# Patient Record
Sex: Male | Born: 1950 | Race: White | Hispanic: No | Smoking: Never smoker
Health system: Southern US, Community
[De-identification: ages and names within clinical notes are randomized; demographics above are authoritative.]

## PROBLEM LIST (undated history)

## (undated) DIAGNOSIS — I10 Essential (primary) hypertension: Secondary | ICD-10-CM

## (undated) DIAGNOSIS — R748 Abnormal levels of other serum enzymes: Secondary | ICD-10-CM

## (undated) DIAGNOSIS — K219 Gastro-esophageal reflux disease without esophagitis: Secondary | ICD-10-CM

## (undated) HISTORY — DX: Gastro-esophageal reflux disease without esophagitis: K21.9

## (undated) HISTORY — PX: OTHER SURGICAL HISTORY: SHX169

## (undated) HISTORY — PX: HERNIA REPAIR: SHX51

## (undated) HISTORY — DX: Abnormal levels of other serum enzymes: R74.8

## (undated) HISTORY — DX: Essential (primary) hypertension: I10

## (undated) HISTORY — PX: TONSILLECTOMY: SUR1361

---

## 2018-08-31 ENCOUNTER — Ambulatory Visit (INDEPENDENT_AMBULATORY_CARE_PROVIDER_SITE_OTHER): Payer: Medicare Other | Admitting: Internal Medicine

## 2018-08-31 ENCOUNTER — Encounter (INDEPENDENT_AMBULATORY_CARE_PROVIDER_SITE_OTHER): Payer: Self-pay | Admitting: Internal Medicine

## 2018-08-31 ENCOUNTER — Encounter (INDEPENDENT_AMBULATORY_CARE_PROVIDER_SITE_OTHER): Payer: Self-pay | Admitting: *Deleted

## 2018-08-31 VITALS — BP 142/80 | HR 109 | Temp 98.3°F | Resp 14 | Ht 74.0 in | Wt 194.4 lb

## 2018-08-31 DIAGNOSIS — R748 Abnormal levels of other serum enzymes: Secondary | ICD-10-CM | POA: Diagnosis not present

## 2018-08-31 HISTORY — DX: Abnormal levels of other serum enzymes: R74.8

## 2018-08-31 NOTE — Progress Notes (Signed)
   Subjective:    Patient ID: Bill Myers, male    DOB: [birth year], 67 y.o.   MRN: [id number]  HPI Referred by Shon HaleBrittany Joyce for elevated liver enzymes.  He drinks 4-5 beers a week. He use to drink a case of week. Retired [employer] His appetite is not okay. He sometimes does not feel hungry.  His bowel move okay.  His BM are normal. His BM move daily.  No IV drug use.   CV Liver Fibrosis Panel 9/`04/2018 Fibrosis score 0.29 -F1,,  Necroinflammat Act. Score 0.85 Haptoglobin 102, total bili 0.3, GGT 411, ALT 244 Necroinflammatory Activity Grade A3_ Severe activity.  08/05/2018 Acute hepatitis panel negative.  PT/INR 0.9 and 10.0, GGT 407.  07/23/2018 H and H 13.7 and 39.3, Platelet ct 208 Review of Systems Past Medical History:  Diagnosis Date  . GERD (gastroesophageal reflux disease)   . HTN (hypertension)     Past Surgical History:  Procedure Laterality Date  . HERNIA REPAIR      No Known Allergies  Current Outpatient Medications on File Prior to Visit  Medication Sig Dispense Refill  . esomeprazole (NEXIUM) 20 MG capsule Take by mouth.    Marland Kitchen. lisinopril (PRINIVIL,ZESTRIL) 20 MG tablet Take 20 mg by mouth daily.  2  . spironolactone (ALDACTONE) 25 MG tablet Take 25 mg by mouth daily.  2   No current facility-administered medications on file prior to visit.         Objective:   Physical Exam Blood pressure (!) 142/80, pulse (!) 109, temperature 98.3 F (36.8 C), resp. rate 14, height 6\' 2"  (1.88 m), weight 194 lb 6.4 oz (88.2 kg).  Alert and oriented. Skin warm and dry. Oral mucosa is moist.   . Sclera anicteric, conjunctivae is pink. Thyroid not enlarged. No cervical lymphadenopathy. Lungs clear. Heart regular rate and rhythm.  Abdomen is soft. Bowel sounds are positive. No hepatomegaly. No abdominal masses felt. No tenderness.  No edema to lower extremities.          Assessment & Plan:  Elevated liver enzymes.  Will repeat Liver enzymes.  Platelet ct looks  good.  US RUQ.  Needs to exercise. Try to stop drinking.

## 2018-08-31 NOTE — Patient Instructions (Signed)
US RUQ 

## 2018-09-01 ENCOUNTER — Telehealth (INDEPENDENT_AMBULATORY_CARE_PROVIDER_SITE_OTHER): Payer: Self-pay | Admitting: Internal Medicine

## 2018-09-01 DIAGNOSIS — K760 Fatty (change of) liver, not elsewhere classified: Secondary | ICD-10-CM

## 2018-09-01 DIAGNOSIS — R748 Abnormal levels of other serum enzymes: Secondary | ICD-10-CM

## 2018-09-01 LAB — HEPATIC FUNCTION PANEL
AG Ratio: 1.6 (calc) (ref 1.0–2.5)
ALBUMIN MSPROF: 4.6 g/dL (ref 3.6–5.1)
ALT: 127 U/L — ABNORMAL HIGH (ref 9–46)
AST: 146 U/L — AB (ref 10–35)
Alkaline phosphatase (APISO): 65 U/L (ref 40–115)
BILIRUBIN DIRECT: 0.2 mg/dL (ref 0.0–0.2)
BILIRUBIN TOTAL: 0.6 mg/dL (ref 0.2–1.2)
Globulin: 2.8 g/dL (calc) (ref 1.9–3.7)
Indirect Bilirubin: 0.4 mg/dL (calc) (ref 0.2–1.2)
Total Protein: 7.4 g/dL (ref 6.1–8.1)

## 2018-09-01 NOTE — Telephone Encounter (Signed)
Labs for auto immune process ordered.

## 2018-09-03 ENCOUNTER — Ambulatory Visit (HOSPITAL_COMMUNITY)
Admission: RE | Admit: 2018-09-03 | Discharge: 2018-09-03 | Disposition: A | Discharge status: Home / Self Care | Payer: Medicare Other | Source: Ambulatory Visit | Attending: Internal Medicine | Admitting: Internal Medicine

## 2018-09-03 DIAGNOSIS — R748 Abnormal levels of other serum enzymes: Secondary | ICD-10-CM | POA: Diagnosis present

## 2018-09-07 LAB — ALPHA-1-ANTITRYPSIN: A1 ANTITRYPSIN SER: 116 mg/dL (ref 83–199)

## 2018-09-07 LAB — FERRITIN: Ferritin: 1642 ng/mL — ABNORMAL HIGH (ref 24–380)

## 2018-09-07 LAB — PROTIME-INR
INR: 0.9
Prothrombin Time: 9.9 s (ref 9.0–11.5)

## 2018-09-07 LAB — CERULOPLASMIN: CERULOPLASMIN: 29 mg/dL (ref 18–36)

## 2018-09-07 LAB — IGG: IGG (IMMUNOGLOBIN G), SERUM: 1095 mg/dL (ref 600–1540)

## 2018-09-07 LAB — ANTI-SMOOTH MUSCLE ANTIBODY, IGG

## 2018-09-07 LAB — ANTI-NUCLEAR AB-TITER (ANA TITER)

## 2018-09-07 LAB — ANA: ANA: POSITIVE — AB

## 2018-09-09 ENCOUNTER — Telehealth (INDEPENDENT_AMBULATORY_CARE_PROVIDER_SITE_OTHER): Payer: Self-pay | Admitting: Internal Medicine

## 2018-09-09 DIAGNOSIS — R7989 Other specified abnormal findings of blood chemistry: Secondary | ICD-10-CM

## 2018-09-09 DIAGNOSIS — K760 Fatty (change of) liver, not elsewhere classified: Secondary | ICD-10-CM

## 2018-09-09 DIAGNOSIS — R748 Abnormal levels of other serum enzymes: Secondary | ICD-10-CM

## 2018-09-09 DIAGNOSIS — K7400 Hepatic fibrosis, unspecified: Secondary | ICD-10-CM

## 2018-09-09 NOTE — Telephone Encounter (Signed)
Iron and TIBC ordered

## 2018-09-09 NOTE — Telephone Encounter (Signed)
[place] sch'd 09/14/18 at 130 (1015), npo after midnight, patient aware

## 2018-09-09 NOTE — Telephone Encounter (Signed)
[place] RUQ elast ordered

## 2018-09-10 ENCOUNTER — Other Ambulatory Visit (INDEPENDENT_AMBULATORY_CARE_PROVIDER_SITE_OTHER): Payer: Self-pay | Admitting: Internal Medicine

## 2018-09-10 LAB — IRON, TOTAL/TOTAL IRON BINDING CAP
%SAT: 57 % — AB (ref 20–48)
Iron: 152 ug/dL (ref 50–180)
TIBC: 269 ug/dL (ref 250–425)

## 2018-09-14 ENCOUNTER — Ambulatory Visit (HOSPITAL_COMMUNITY)
Admission: RE | Admit: 2018-09-14 | Discharge: 2018-09-14 | Disposition: A | Discharge status: Home / Self Care | Payer: Medicare Other | Source: Ambulatory Visit | Attending: Internal Medicine | Admitting: Internal Medicine

## 2018-09-14 DIAGNOSIS — R748 Abnormal levels of other serum enzymes: Secondary | ICD-10-CM

## 2018-09-14 DIAGNOSIS — K760 Fatty (change of) liver, not elsewhere classified: Secondary | ICD-10-CM | POA: Diagnosis not present

## 2018-09-14 DIAGNOSIS — K7400 Hepatic fibrosis, unspecified: Secondary | ICD-10-CM

## 2018-09-14 DIAGNOSIS — R7989 Other specified abnormal findings of blood chemistry: Secondary | ICD-10-CM | POA: Diagnosis not present

## 2018-09-17 ENCOUNTER — Other Ambulatory Visit (INDEPENDENT_AMBULATORY_CARE_PROVIDER_SITE_OTHER): Payer: Self-pay | Admitting: *Deleted

## 2018-09-17 DIAGNOSIS — R7989 Other specified abnormal findings of blood chemistry: Secondary | ICD-10-CM

## 2018-09-17 DIAGNOSIS — K7400 Hepatic fibrosis, unspecified: Secondary | ICD-10-CM

## 2018-09-17 DIAGNOSIS — R748 Abnormal levels of other serum enzymes: Secondary | ICD-10-CM

## 2018-10-12 ENCOUNTER — Other Ambulatory Visit (INDEPENDENT_AMBULATORY_CARE_PROVIDER_SITE_OTHER): Payer: Self-pay | Admitting: *Deleted

## 2018-10-12 ENCOUNTER — Encounter (INDEPENDENT_AMBULATORY_CARE_PROVIDER_SITE_OTHER): Payer: Self-pay | Admitting: *Deleted

## 2018-10-12 DIAGNOSIS — R748 Abnormal levels of other serum enzymes: Secondary | ICD-10-CM

## 2018-10-12 DIAGNOSIS — K7400 Hepatic fibrosis, unspecified: Secondary | ICD-10-CM

## 2018-10-12 DIAGNOSIS — R7989 Other specified abnormal findings of blood chemistry: Secondary | ICD-10-CM

## 2018-11-10 LAB — HEPATIC FUNCTION PANEL
AG Ratio: 1.7 (calc) (ref 1.0–2.5)
ALBUMIN MSPROF: 4.5 g/dL (ref 3.6–5.1)
ALT: 29 U/L (ref 9–46)
AST: 41 U/L — ABNORMAL HIGH (ref 10–35)
Alkaline phosphatase (APISO): 45 U/L (ref 40–115)
BILIRUBIN DIRECT: 0.1 mg/dL (ref 0.0–0.2)
BILIRUBIN INDIRECT: 0.4 mg/dL (ref 0.2–1.2)
BILIRUBIN TOTAL: 0.5 mg/dL (ref 0.2–1.2)
Globulin: 2.7 g/dL (calc) (ref 1.9–3.7)
Total Protein: 7.2 g/dL (ref 6.1–8.1)

## 2018-11-11 ENCOUNTER — Other Ambulatory Visit (INDEPENDENT_AMBULATORY_CARE_PROVIDER_SITE_OTHER): Payer: Self-pay | Admitting: *Deleted

## 2018-11-11 DIAGNOSIS — R7989 Other specified abnormal findings of blood chemistry: Secondary | ICD-10-CM

## 2018-11-11 DIAGNOSIS — K7400 Hepatic fibrosis, unspecified: Secondary | ICD-10-CM

## 2018-11-11 DIAGNOSIS — R748 Abnormal levels of other serum enzymes: Secondary | ICD-10-CM

## 2019-01-11 ENCOUNTER — Encounter (INDEPENDENT_AMBULATORY_CARE_PROVIDER_SITE_OTHER): Payer: Self-pay | Admitting: *Deleted

## 2019-01-11 ENCOUNTER — Other Ambulatory Visit (INDEPENDENT_AMBULATORY_CARE_PROVIDER_SITE_OTHER): Payer: Self-pay | Admitting: *Deleted

## 2019-01-11 DIAGNOSIS — K7400 Hepatic fibrosis, unspecified: Secondary | ICD-10-CM

## 2019-01-11 DIAGNOSIS — R748 Abnormal levels of other serum enzymes: Secondary | ICD-10-CM

## 2019-01-11 DIAGNOSIS — R7989 Other specified abnormal findings of blood chemistry: Secondary | ICD-10-CM

## 2019-02-06 LAB — HEPATIC FUNCTION PANEL
AG RATIO: 1.6 (calc) (ref 1.0–2.5)
ALBUMIN MSPROF: 4.5 g/dL (ref 3.6–5.1)
ALT: 22 U/L (ref 9–46)
AST: 25 U/L (ref 10–35)
Alkaline phosphatase (APISO): 45 U/L (ref 35–144)
BILIRUBIN DIRECT: 0.2 mg/dL (ref 0.0–0.2)
BILIRUBIN TOTAL: 0.7 mg/dL (ref 0.2–1.2)
GLOBULIN: 2.9 g/dL (ref 1.9–3.7)
Indirect Bilirubin: 0.5 mg/dL (calc) (ref 0.2–1.2)
Total Protein: 7.4 g/dL (ref 6.1–8.1)

## 2019-05-12 ENCOUNTER — Ambulatory Visit (INDEPENDENT_AMBULATORY_CARE_PROVIDER_SITE_OTHER): Payer: Medicare Other | Admitting: Internal Medicine

## 2019-05-12 ENCOUNTER — Encounter (INDEPENDENT_AMBULATORY_CARE_PROVIDER_SITE_OTHER): Payer: Self-pay | Admitting: Internal Medicine

## 2019-05-12 ENCOUNTER — Other Ambulatory Visit: Payer: Self-pay

## 2019-05-12 VITALS — BP 147/77 | HR 77 | Temp 98.1°F | Ht 76.0 in | Wt 186.3 lb

## 2019-05-12 DIAGNOSIS — R7989 Other specified abnormal findings of blood chemistry: Secondary | ICD-10-CM | POA: Diagnosis not present

## 2019-05-12 DIAGNOSIS — R748 Abnormal levels of other serum enzymes: Secondary | ICD-10-CM | POA: Diagnosis not present

## 2019-05-12 NOTE — Progress Notes (Signed)
   Subjective:    Patient ID: Bill Myers, male    DOB: 08-03-1951, 68 y.o.   MRN: [id number]  HPI Here today for f/u of elevated liver enzymes. Last seen 08/31/2018. Had been drinking 4-5 beers a days. In January he stopped drinking.  He is doing well. He is retired. He walks a mile on the treadmill every morning. He has gained from 174 to 186 He occasionally has constipation and takes a stool softener. Has never smoked.  08/05/2018 Acute hepatitis panel negative.  GGT 407.  09/02/2018 Igg 1,095, ANA 1:320, Ferritin 1,642, SMA less than 20, Ceruloplasmin 29, Alpha 1 antitrypsin 116.  INR 0.9, PT 9.9. Iron 152, TIBC 269, % Sat 57.   09/14/2018 US Abdomen with Elast: F0-F1 IMPRESSION: 1. Inhomogeneous echogenic liver parenchyma consistent with hepatic steatosis. 2. No gallstones.    09/03/2018 [place] RUQ:   Hepatic Function Latest Ref Rng & Units 02/05/2019 11/10/2018 08/31/2018  Total Protein 6.1 - 8.1 g/dL 7.4 7.2 7.4  AST 10 - 35 U/L 25 41(H) 146(H)  ALT 9 - 46 U/L 22 29 127(H)  Total Bilirubin 0.2 - 1.2 mg/dL 0.7 0.5 0.6  Bilirubin, Direct 0.0 - 0.2 mg/dL 0.2 0.1 0.2         CV Liver Fibrosis Panel 08/20/2018 Fibrosis score 0.29 -F1,,  Necroinflammat Act. Score 0.85 Haptoglobin 102, total bili 0.3, GGT 411, ALT 244 Necroinflammatory Activity Grade A3_ Severe activity.  08/05/2018 Acute hepatitis panel negative.  PT/INR 0.9 and 10.0, GGT 407.  07/23/2018 H and H 13.7 and 39.3, Platelet ct 208 Review of Systems Past Medical History:  Diagnosis Date  . Elevated liver enzymes 08/31/2018  . GERD (gastroesophageal reflux disease)   . HTN (hypertension)     Past Surgical History:  Procedure Laterality Date  . HERNIA REPAIR    . TONSILLECTOMY    . vascectomy      No Known Allergies  Current Outpatient Medications on File Prior to Visit  Medication Sig Dispense Refill  . lisinopril (PRINIVIL,ZESTRIL) 20 MG tablet Take 20 mg by mouth daily.  2  . spironolactone  (ALDACTONE) 25 MG tablet Take 25 mg by mouth daily.  2   No current facility-administered medications on file prior to visit.         Objective:   Physical Exam Blood pressure (!) 147/77, pulse 77, temperature 98.1 F (36.7 C), height 6\' 4"  (1.93 m), weight 186 lb 4.8 oz (84.5 kg).  Alert and oriented. Skin warm and dry. Oral mucosa is moist.   . Sclera anicteric, conjunctivae is pink. Thyroid not enlarged. No cervical lymphadenopathy. Lungs clear. Heart regular rate and rhythm.  Abdomen is soft. Bowel sounds are positive. No hepatomegaly. No abdominal masses felt. No tenderness.  No edema to lower extremities.        Assessment & Plan:  Elevated liver enzymes. Elevated ferritin. Am going to repeat Hepatic, Ferritin and CBC.  OV in 1 year.

## 2019-05-12 NOTE — Patient Instructions (Signed)
CBC, Hepatic and ferritin. OV in 1 year.

## 2019-05-13 LAB — FERRITIN: Ferritin: 338 ng/mL (ref 24–380)

## 2019-05-13 LAB — CBC WITH DIFFERENTIAL/PLATELET
Absolute Monocytes: 595 cells/uL (ref 200–950)
Basophils Absolute: 19 cells/uL (ref 0–200)
Basophils Relative: 0.4 %
Eosinophils Absolute: 72 cells/uL (ref 15–500)
Eosinophils Relative: 1.5 %
HCT: 37.4 % — ABNORMAL LOW (ref 38.5–50.0)
Hemoglobin: 12.6 g/dL — ABNORMAL LOW (ref 13.2–17.1)
Lymphs Abs: 1781 cells/uL (ref 850–3900)
MCH: 31.7 pg (ref 27.0–33.0)
MCHC: 33.7 g/dL (ref 32.0–36.0)
MCV: 94 fL (ref 80.0–100.0)
MPV: 9.9 fL (ref 7.5–12.5)
Monocytes Relative: 12.4 %
Neutro Abs: 2333 cells/uL (ref 1500–7800)
Neutrophils Relative %: 48.6 %
Platelets: 255 10*3/uL (ref 140–400)
RBC: 3.98 10*6/uL — ABNORMAL LOW (ref 4.20–5.80)
RDW: 12 % (ref 11.0–15.0)
Total Lymphocyte: 37.1 %
WBC: 4.8 10*3/uL (ref 3.8–10.8)

## 2019-05-13 LAB — HEPATIC FUNCTION PANEL
AG Ratio: 2 (calc) (ref 1.0–2.5)
ALT: 11 U/L (ref 9–46)
AST: 18 U/L (ref 10–35)
Albumin: 4.7 g/dL (ref 3.6–5.1)
Alkaline phosphatase (APISO): 52 U/L (ref 35–144)
Bilirubin, Direct: 0.1 mg/dL (ref 0.0–0.2)
Globulin: 2.4 g/dL (calc) (ref 1.9–3.7)
Indirect Bilirubin: 0.3 mg/dL (calc) (ref 0.2–1.2)
Total Bilirubin: 0.4 mg/dL (ref 0.2–1.2)
Total Protein: 7.1 g/dL (ref 6.1–8.1)

## 2019-05-17 ENCOUNTER — Other Ambulatory Visit (INDEPENDENT_AMBULATORY_CARE_PROVIDER_SITE_OTHER): Payer: Self-pay | Admitting: *Deleted

## 2019-05-17 DIAGNOSIS — R7989 Other specified abnormal findings of blood chemistry: Secondary | ICD-10-CM

## 2019-08-28 IMAGING — US US ABDOMEN LIMITED W/ ELASTOGRAPHY
1 series · 13 of 25 positions shown · non-contrast
Comparison: 09/03/2018 right upper quadrant abdominal sonogram.

CLINICAL DATA: Elevated liver enzymes.  Elevated ferritin.

EXAM:
US ABDOMEN LIMITED - RIGHT UPPER QUADRANT
ULTRASOUND HEPATIC ELASTOGRAPHY
TECHNIQUE: Limited right upper quadrant abdominal ultrasound was performed. In
addition, ultrasound elastography evaluation of the liver was
performed. A region of interest was placed in the right lobe of the
liver. Following application of a compressive sonographic pulse,
shear waves were detected in the adjacent hepatic tissue and the
shear wave velocity was calculated. Multiple assessments were
performed at the selected site. Median shear wave velocity is
correlated to a Metavir fibrosis score.

[Series 1: us abdomen limited w/ elastography · 13 of 38 slices shown]
[im 1/38]
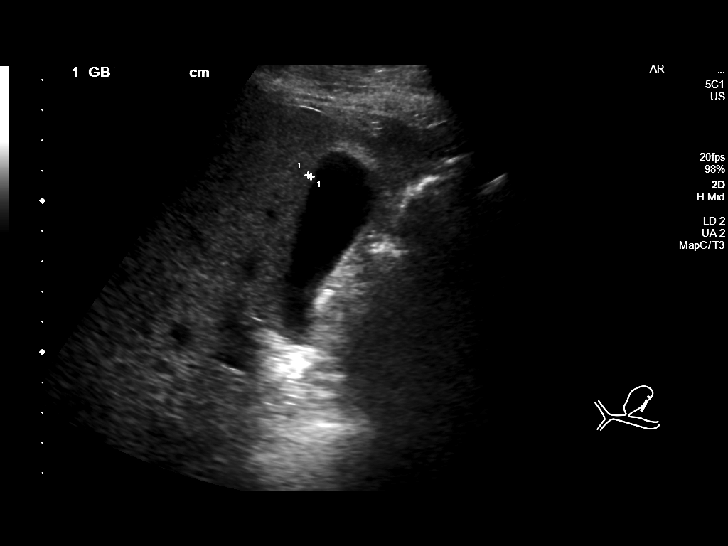
[im 4/38]
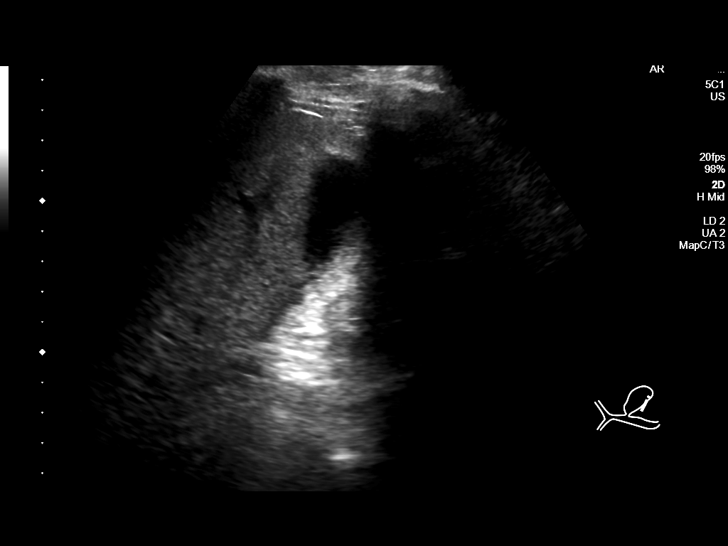
[im 7/38]
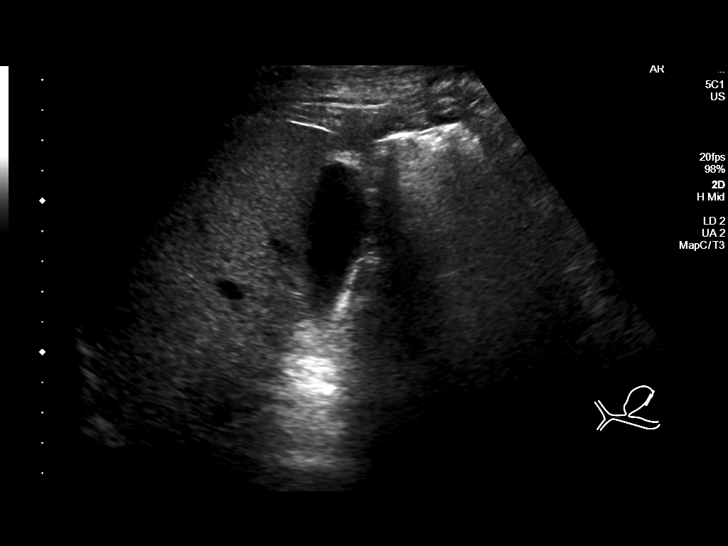
[im 10/38]
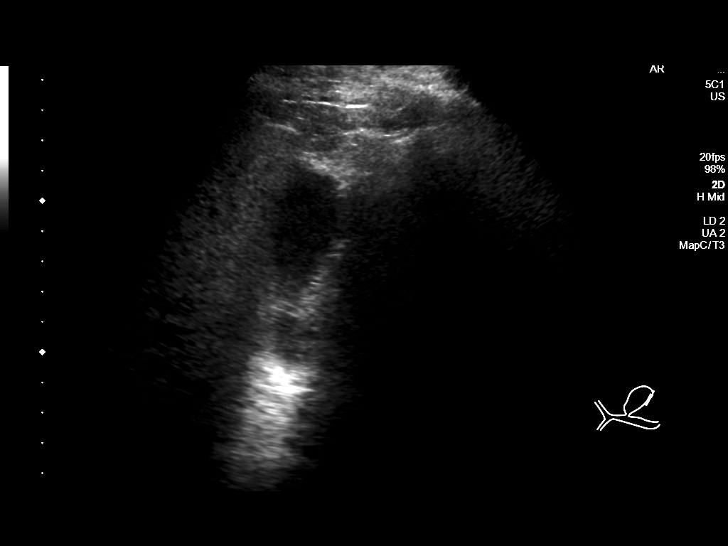
[im 13/38]
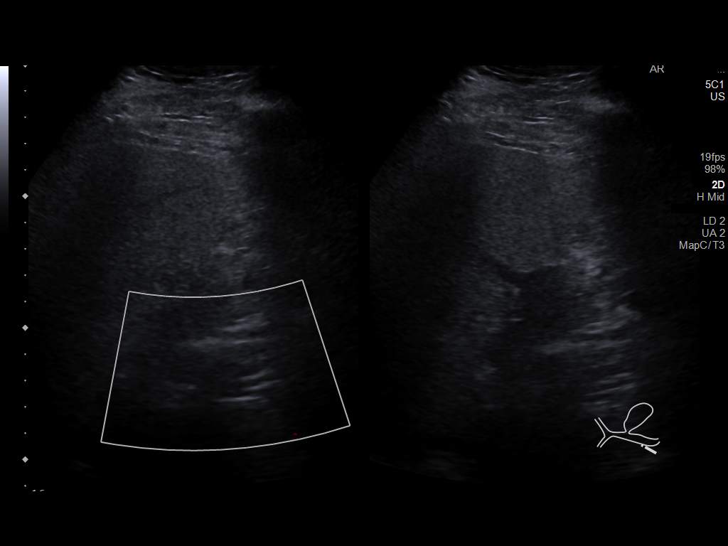
[im 16/38]
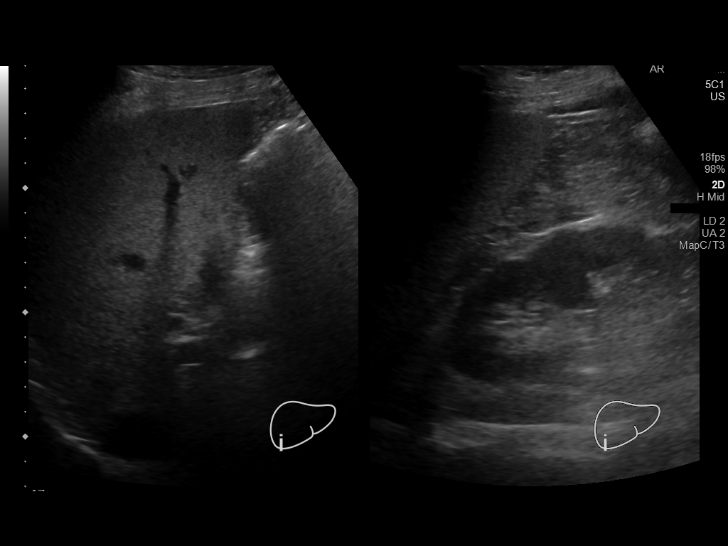
[im 19/38]
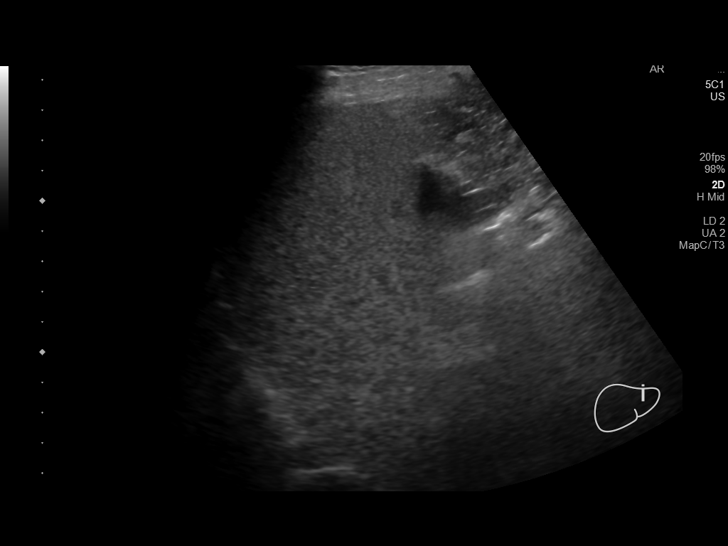
[im 22/38]
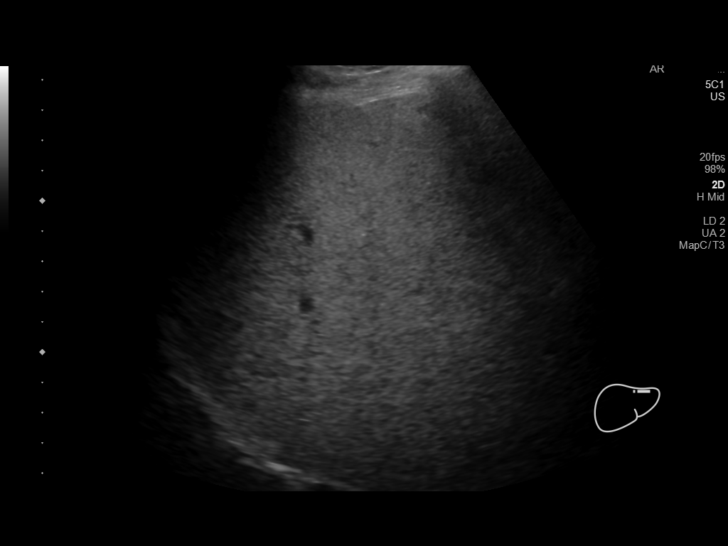
[im 25/38]
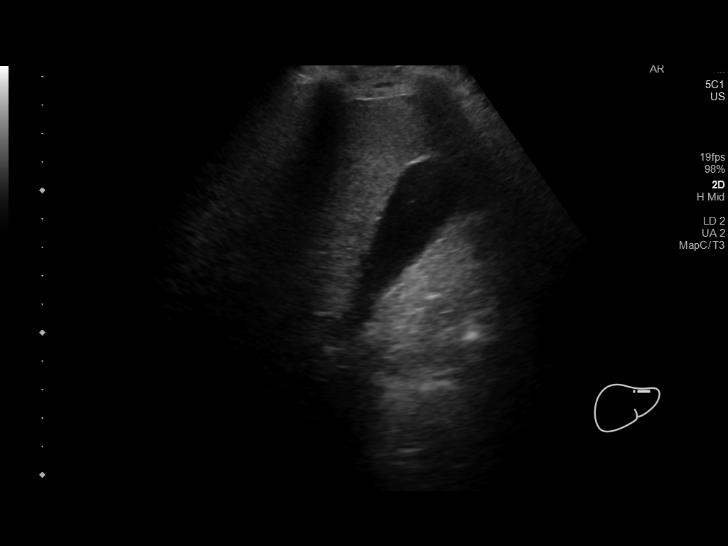
[im 28/38]
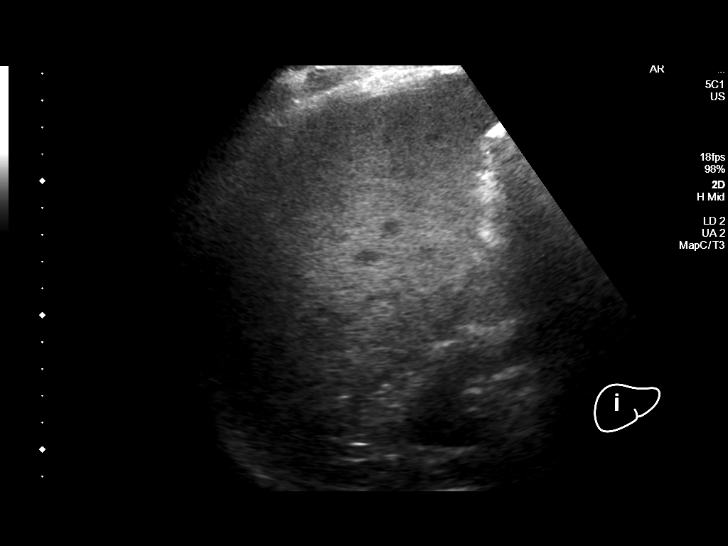
[im 31/38]
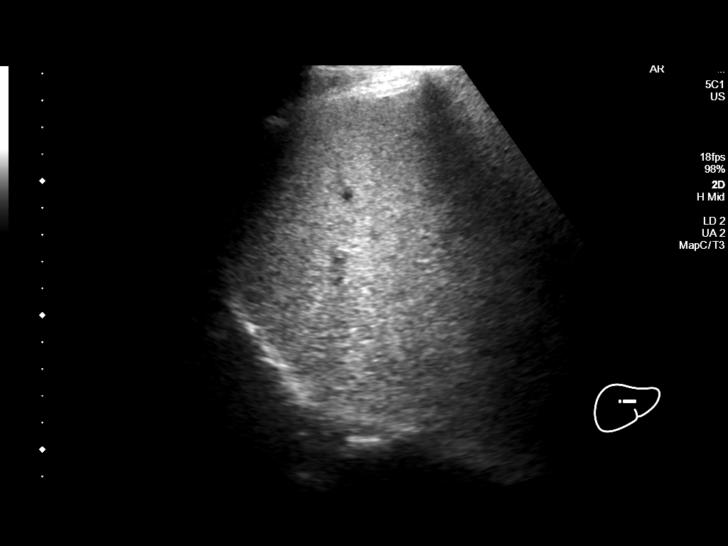
[im 34/38]
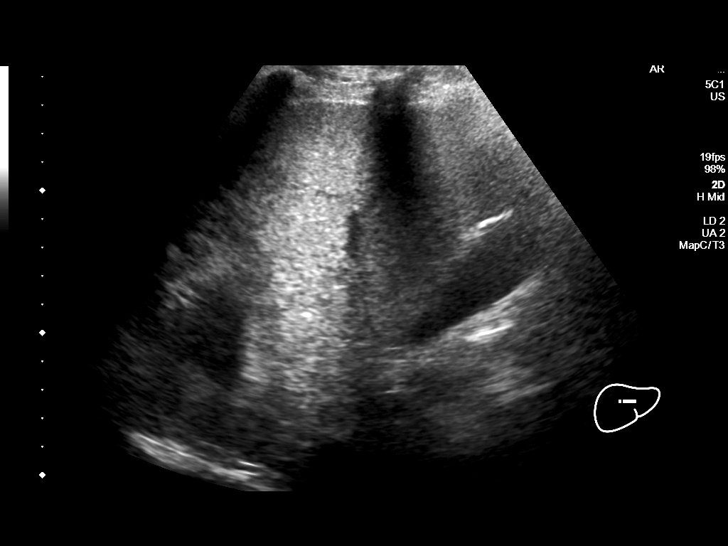
[im 38/38]
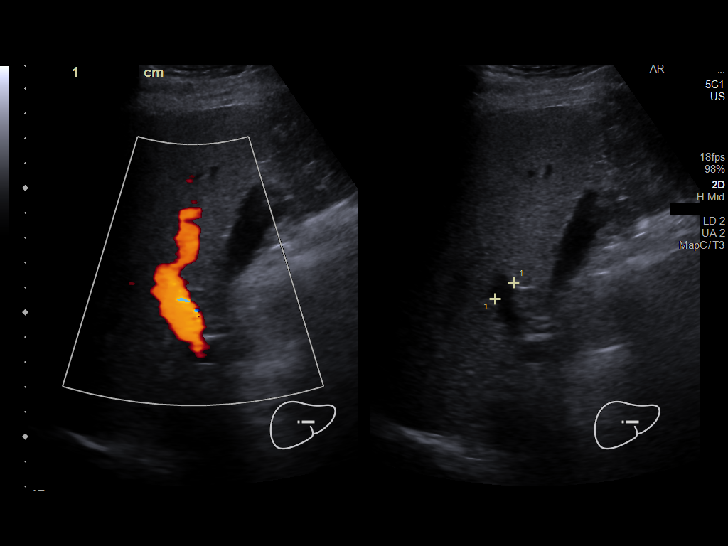

[13 of 25 positions shown; findings below may reference images not displayed]

FINDINGS: ULTRASOUND ABDOMEN LIMITED RIGHT UPPER QUADRANT

Gallbladder:

No gallstones or wall thickening visualized. No sonographic Murphy
sign noted.

Common bile duct:

Diameter: 5 mm

Liver:

Liver parenchyma is diffusely moderately echogenic with mild
posterior acoustic attenuation, compatible with diffuse hepatic
steatosis. No definite liver surface irregularity. No liver mass,
noting decreased sensitivity in the setting of an echogenic liver.
Portal vein is patent on color Doppler imaging with normal direction
of blood flow towards the liver.

ULTRASOUND HEPATIC ELASTOGRAPHY

Device: Siemens Helix VTQ

Patient position: Left Lateral Decubitus

Transducer [id number]

Number of measurements: 10

Hepatic segment:  8

Median velocity:   1.02 m/sec

IQR:

IQR/Median velocity ratio:

Corresponding Metavir fibrosis score:  F0/F1

Risk of fibrosis: Minimal

Limitations of exam: None

Please note that abnormal shear wave velocities may also be
identified in clinical settings other than with hepatic fibrosis,
such as: acute hepatitis, elevated right heart and central venous
pressures including use of beta blockers, Eii Pame disease
(Opitz), infiltrative processes such as
mastocytosis/amyloidosis/infiltrative tumor, extrahepatic
cholestasis, in the post-prandial state, and liver transplantation.
Correlation with patient history, laboratory data, and clinical
condition recommended.
IMPRESSION: ULTRASOUND ABDOMEN: Diffuse hepatic steatosis. Otherwise normal
right upper quadrant abdominal sonogram.

ULTRASOUND HEPATIC ELASTOGRAHY:

Median hepatic shear wave velocity is calculated at 1.02 m/sec.

Corresponding Metavir fibrosis score is F0/F1.

Risk of fibrosis is Minimal.

Follow-up: None required.

## 2019-11-08 ENCOUNTER — Other Ambulatory Visit (INDEPENDENT_AMBULATORY_CARE_PROVIDER_SITE_OTHER): Payer: Self-pay | Admitting: *Deleted

## 2019-11-08 DIAGNOSIS — R7989 Other specified abnormal findings of blood chemistry: Secondary | ICD-10-CM

## 2019-11-16 ENCOUNTER — Other Ambulatory Visit (INDEPENDENT_AMBULATORY_CARE_PROVIDER_SITE_OTHER): Payer: Self-pay | Admitting: *Deleted

## 2019-11-16 DIAGNOSIS — R7989 Other specified abnormal findings of blood chemistry: Secondary | ICD-10-CM

## 2019-11-16 DIAGNOSIS — K7400 Hepatic fibrosis, unspecified: Secondary | ICD-10-CM

## 2019-11-16 DIAGNOSIS — K760 Fatty (change of) liver, not elsewhere classified: Secondary | ICD-10-CM

## 2019-11-17 LAB — HEPATIC FUNCTION PANEL
AG Ratio: 1.6 (calc) (ref 1.0–2.5)
ALT: 25 U/L (ref 9–46)
AST: 31 U/L (ref 10–35)
Albumin: 4.5 g/dL (ref 3.6–5.1)
Alkaline phosphatase (APISO): 43 U/L (ref 35–144)
Bilirubin, Direct: 0.2 mg/dL (ref 0.0–0.2)
Globulin: 2.8 g/dL (calc) (ref 1.9–3.7)
Indirect Bilirubin: 0.5 mg/dL (calc) (ref 0.2–1.2)
Total Bilirubin: 0.7 mg/dL (ref 0.2–1.2)
Total Protein: 7.3 g/dL (ref 6.1–8.1)

## 2019-11-17 LAB — FERRITIN: Ferritin: 260 ng/mL (ref 24–380)

## 2019-11-19 ENCOUNTER — Other Ambulatory Visit (INDEPENDENT_AMBULATORY_CARE_PROVIDER_SITE_OTHER): Payer: Self-pay | Admitting: *Deleted

## 2019-11-19 DIAGNOSIS — K7400 Hepatic fibrosis, unspecified: Secondary | ICD-10-CM

## 2019-11-19 DIAGNOSIS — R7989 Other specified abnormal findings of blood chemistry: Secondary | ICD-10-CM

## 2020-04-11 ENCOUNTER — Other Ambulatory Visit (INDEPENDENT_AMBULATORY_CARE_PROVIDER_SITE_OTHER): Payer: Self-pay | Admitting: *Deleted

## 2020-04-11 DIAGNOSIS — K7400 Hepatic fibrosis, unspecified: Secondary | ICD-10-CM

## 2020-04-11 DIAGNOSIS — R7989 Other specified abnormal findings of blood chemistry: Secondary | ICD-10-CM

## 2020-05-11 ENCOUNTER — Ambulatory Visit (INDEPENDENT_AMBULATORY_CARE_PROVIDER_SITE_OTHER): Payer: Medicare Other | Admitting: Nurse Practitioner

## 2020-05-11 ENCOUNTER — Ambulatory Visit (INDEPENDENT_AMBULATORY_CARE_PROVIDER_SITE_OTHER): Payer: Medicare Other | Admitting: Gastroenterology

## 2020-05-16 ENCOUNTER — Ambulatory Visit (INDEPENDENT_AMBULATORY_CARE_PROVIDER_SITE_OTHER): Payer: Medicare Other | Admitting: Gastroenterology

## 2020-08-20 IMAGING — US US ABDOMEN LIMITED
1 series · 14 of 25 positions shown · non-contrast
Comparison: None.

CLINICAL DATA: Elevated liver function tests

EXAM:
ULTRASOUND ABDOMEN LIMITED RIGHT UPPER QUADRANT

[Series 1: us abdomen limited · 0.21mm/px · 14 of 61 slices shown]
[im 1/61]
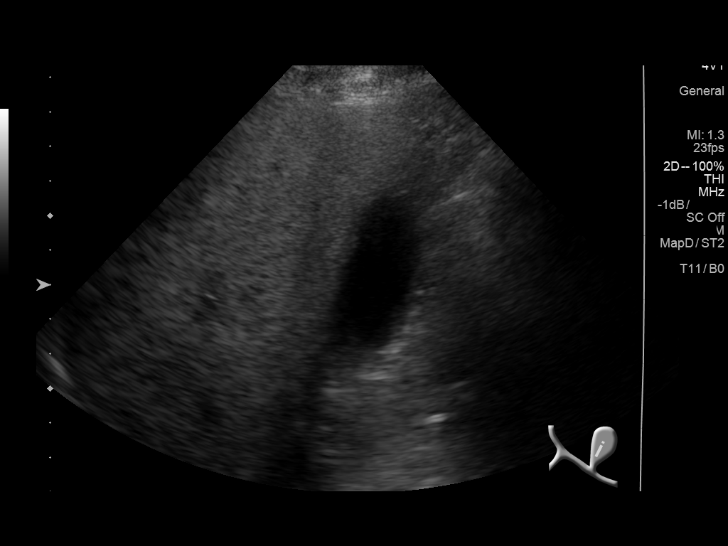
[im 6/61]
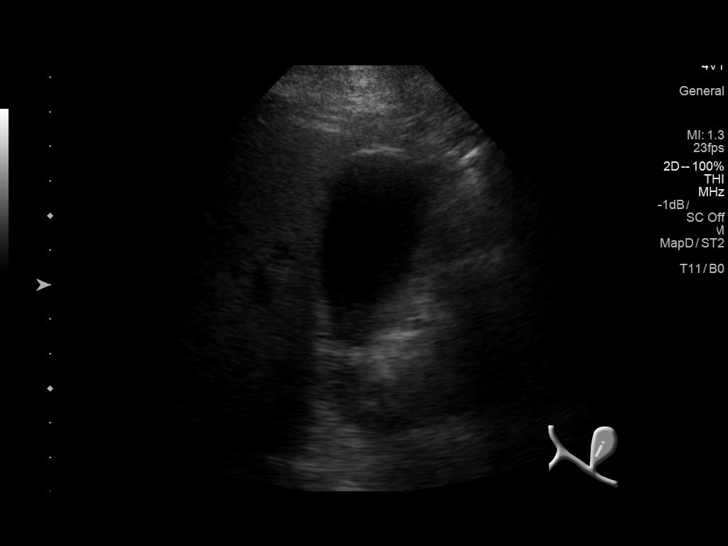
[im 11/61]
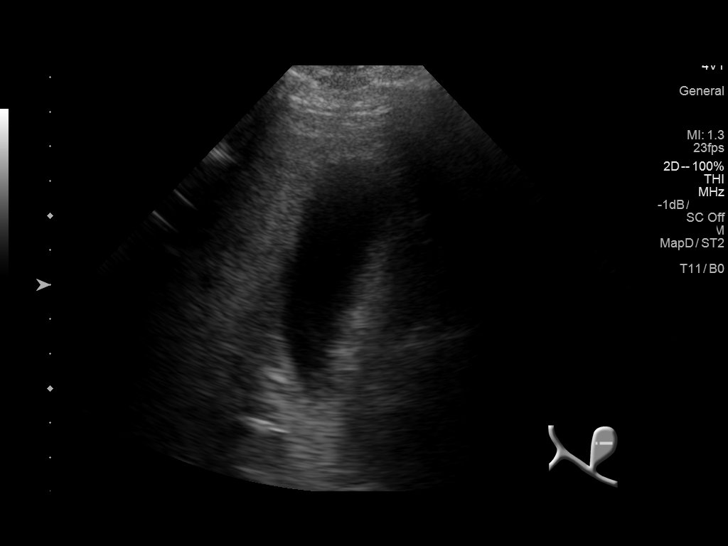
[im 16/61]
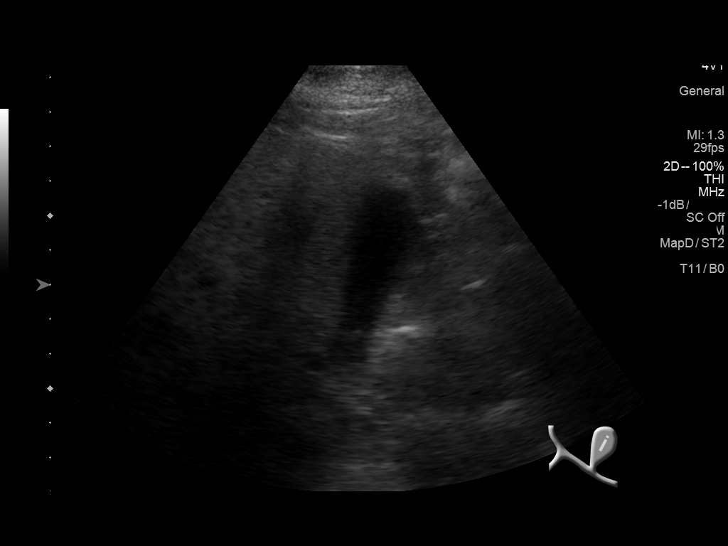
[im 21/61]
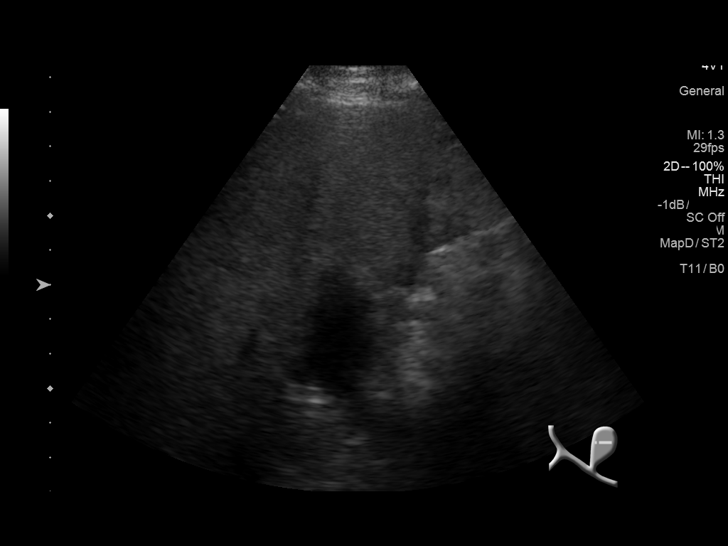
[im 23/61]
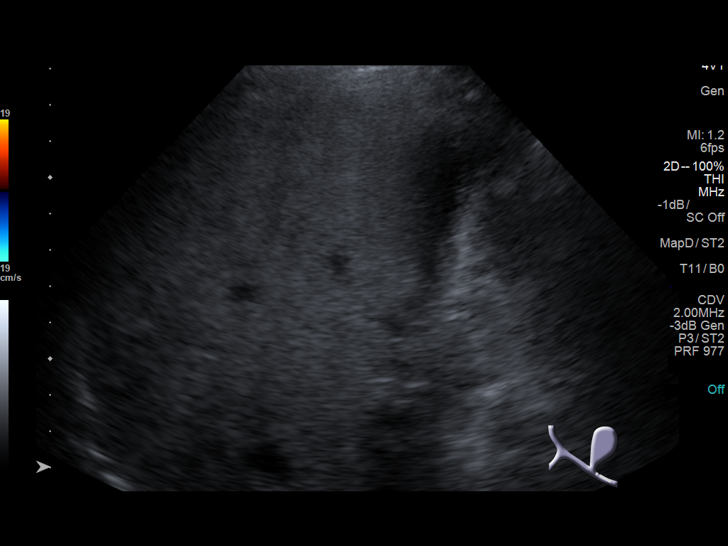
[im 28/61]
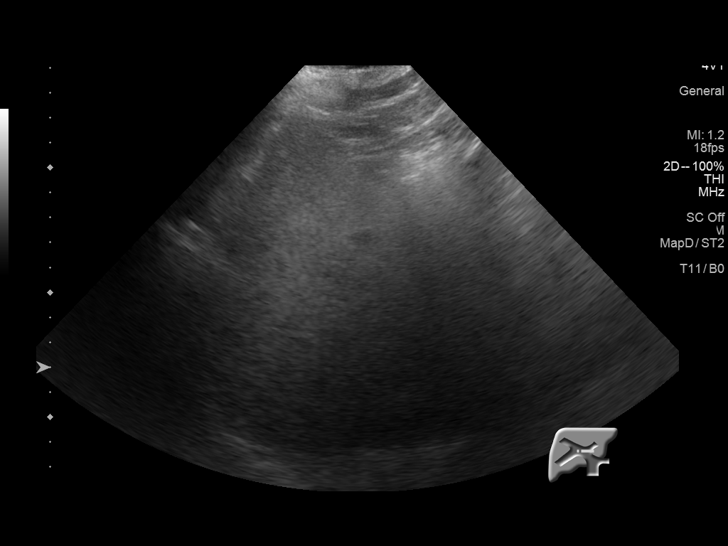
[im 33/61]
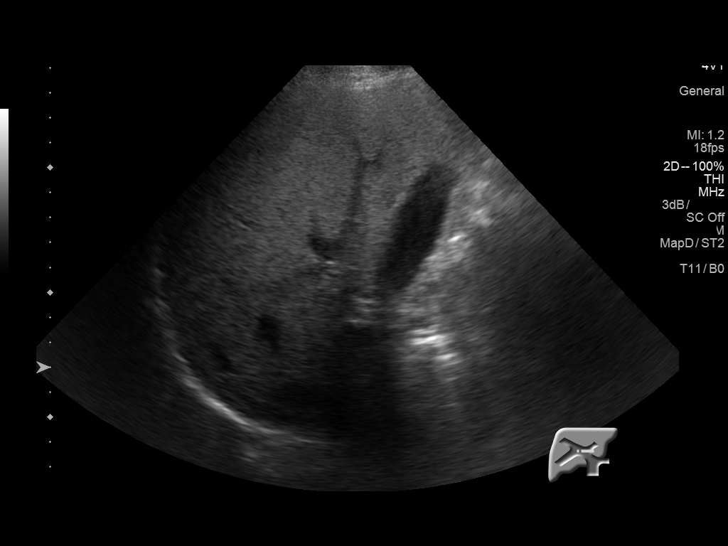
[im 38/61]
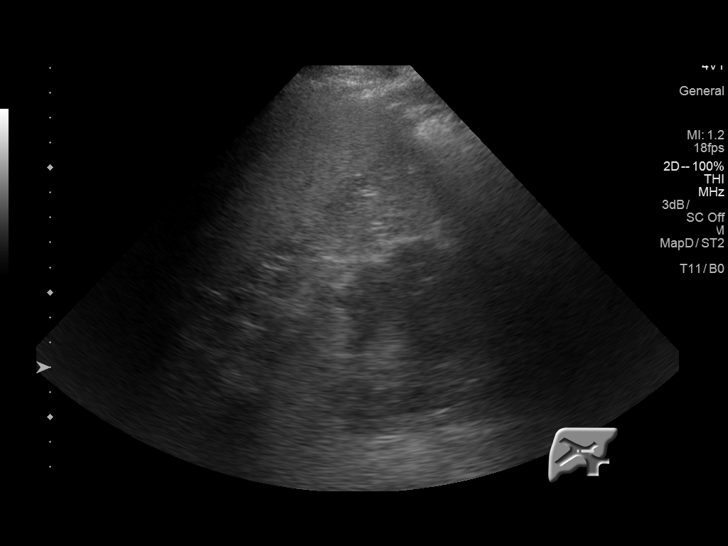
[im 41/61]
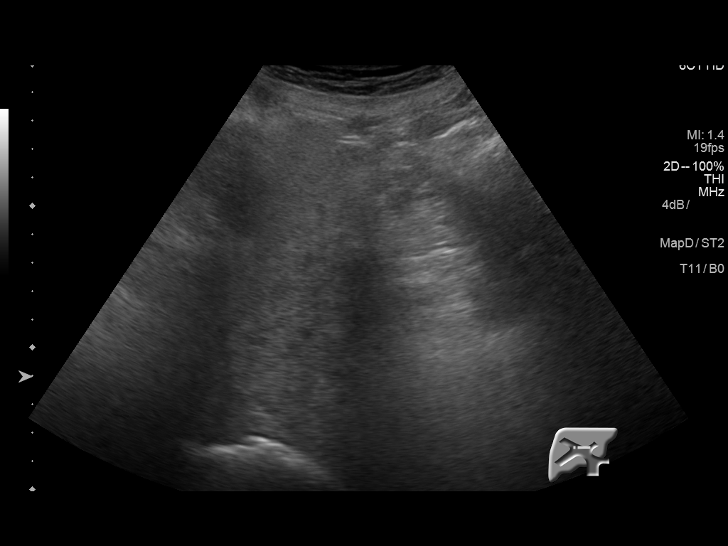
[im 46/61]
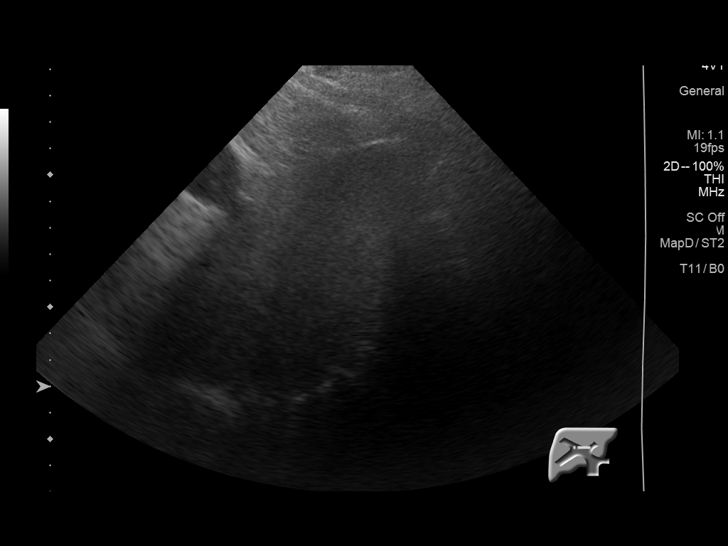
[im 51/61]
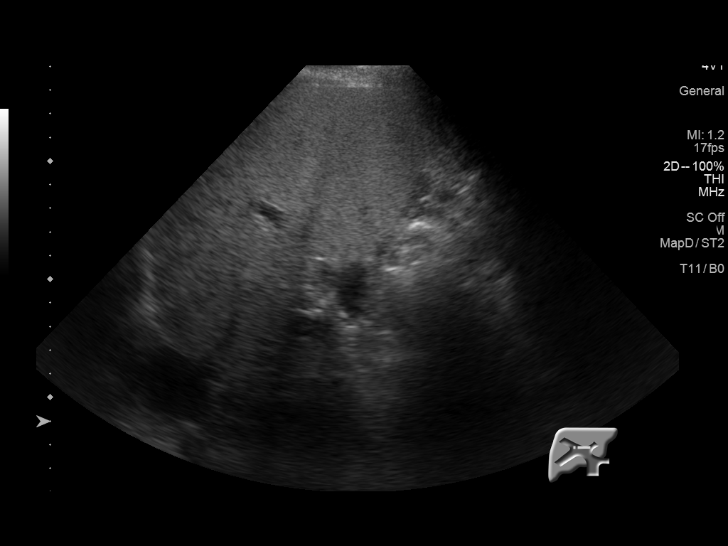
[im 56/61]
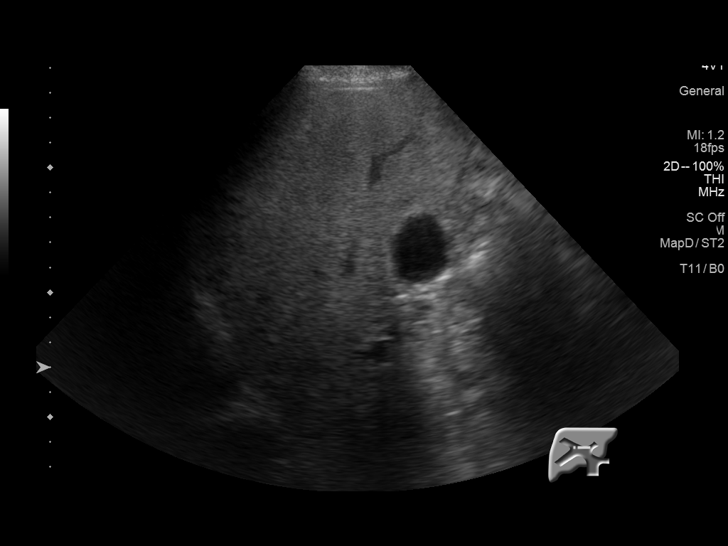
[im 61/61]
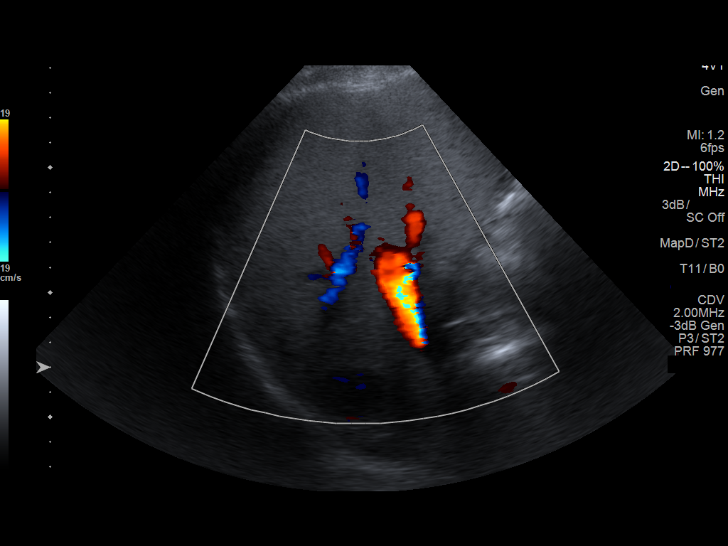

[14 of 25 positions shown; findings below may reference images not displayed]

FINDINGS: Gallbladder:

The gallbladder is visualized and no gallstones are noted. There is
no pain over the gallbladder with compression.

Common bile duct:

Diameter: The common bile duct is normal measuring 4.6 mm in
diameter.

Liver:

The parenchyma of the liver is echogenic and inhomogeneous
consistent with hepatic steatosis. No focal hepatic abnormality is
seen. The portal vein is patent with flow in the normal direction.
IMPRESSION: 1. Inhomogeneous echogenic liver parenchyma consistent with hepatic
steatosis.
2. No gallstones.
